# Patient Record
Sex: Male | Born: 1995 | Race: Black or African American | Hispanic: No | Marital: Single | Smoking: Never smoker
Health system: Southern US, Community
[De-identification: ages and names within clinical notes are randomized; demographics above are authoritative.]

## PROBLEM LIST (undated history)

## (undated) DIAGNOSIS — J45909 Unspecified asthma, uncomplicated: Secondary | ICD-10-CM

---

## 2017-03-31 DIAGNOSIS — R509 Fever, unspecified: Secondary | ICD-10-CM | POA: Diagnosis not present

## 2017-03-31 DIAGNOSIS — R112 Nausea with vomiting, unspecified: Secondary | ICD-10-CM | POA: Insufficient documentation

## 2017-03-31 DIAGNOSIS — R062 Wheezing: Secondary | ICD-10-CM | POA: Insufficient documentation

## 2017-03-31 DIAGNOSIS — R05 Cough: Secondary | ICD-10-CM | POA: Diagnosis present

## 2017-03-31 DIAGNOSIS — M791 Myalgia, unspecified site: Secondary | ICD-10-CM | POA: Insufficient documentation

## 2017-03-31 DIAGNOSIS — J3489 Other specified disorders of nose and nasal sinuses: Secondary | ICD-10-CM | POA: Insufficient documentation

## 2017-03-31 DIAGNOSIS — Z5321 Procedure and treatment not carried out due to patient leaving prior to being seen by health care provider: Secondary | ICD-10-CM | POA: Insufficient documentation

## 2017-04-01 ENCOUNTER — Emergency Department (HOSPITAL_COMMUNITY): Payer: BLUE CROSS/BLUE SHIELD

## 2017-04-01 ENCOUNTER — Encounter (HOSPITAL_COMMUNITY): Payer: Self-pay | Admitting: *Deleted

## 2017-04-01 ENCOUNTER — Emergency Department (HOSPITAL_COMMUNITY)
Admission: EM | Admit: 2017-04-01 | Discharge: 2017-04-01 | Disposition: A | Discharge status: Home / Self Care | Payer: BLUE CROSS/BLUE SHIELD | Attending: Emergency Medicine | Admitting: Emergency Medicine

## 2017-04-01 HISTORY — DX: Unspecified asthma, uncomplicated: J45.909

## 2017-04-01 MED ORDER — ALBUTEROL SULFATE (2.5 MG/3ML) 0.083% IN NEBU
5.0000 mg | INHALATION_SOLUTION | Freq: Once | RESPIRATORY_TRACT | Status: AC
  Administered 2017-04-01: 5 mg via RESPIRATORY_TRACT
  Filled 2017-04-01: qty 6

## 2017-04-01 NOTE — ED Triage Notes (Signed)
Pt states cough, congestion,  fever, nausea, post tussive vomiting, body aches since Monday. Has been taking his inhaler,  dayquil and nyquil for symptoms. Wheezing in triage.

## 2017-04-01 NOTE — ED Notes (Signed)
Pt called out stating he felt like he was wheezing again, pt breathing improved from prior assessment, vss, updated on current status.

## 2017-04-01 NOTE — ED Notes (Signed)
No response in waiting area to be taken to triage room.

## 2017-04-01 NOTE — ED Triage Notes (Signed)
Pt not available in waiting room to take to triage room

## 2018-03-31 ENCOUNTER — Encounter (HOSPITAL_COMMUNITY): Payer: Self-pay

## 2018-03-31 ENCOUNTER — Ambulatory Visit (HOSPITAL_COMMUNITY)
Admission: EM | Admit: 2018-03-31 | Discharge: 2018-03-31 | Disposition: A | Discharge status: Home / Self Care | Payer: BLUE CROSS/BLUE SHIELD | Attending: Family Medicine | Admitting: Family Medicine

## 2018-03-31 DIAGNOSIS — B2790 Infectious mononucleosis, unspecified without complication: Secondary | ICD-10-CM | POA: Diagnosis not present

## 2018-03-31 DIAGNOSIS — J029 Acute pharyngitis, unspecified: Secondary | ICD-10-CM | POA: Diagnosis not present

## 2018-03-31 LAB — POCT INFECTIOUS MONO SCREEN: Mono Screen: POSITIVE — AB

## 2018-03-31 MED ORDER — PREDNISONE 50 MG PO TABS: 50.0000 mg | ORAL_TABLET | Freq: Every day | ORAL | 0 refills | Status: AC

## 2018-03-31 MED ORDER — IBUPROFEN 600 MG PO TABS: 600.0000 mg | ORAL_TABLET | Freq: Four times a day (QID) | ORAL | 0 refills | Status: AC | PRN

## 2018-03-31 MED ORDER — ACETAMINOPHEN 160 MG/5ML PO SOLN
650.0000 mg | Freq: Once | ORAL | Status: AC
  Administered 2018-03-31: 650 mg via ORAL

## 2018-03-31 MED ORDER — ACETAMINOPHEN 160 MG/5ML PO SOLN
ORAL | Status: AC
  Filled 2018-03-31: qty 20.3

## 2018-03-31 NOTE — ED Triage Notes (Signed)
Pt presents with sore throat and fever since yesterday  ?

## 2018-03-31 NOTE — Discharge Instructions (Signed)
Strep negative, Mono positive This is a virus your body should fight over next 4-6 weeks Begin prednisone daily for 5 days to help with swelling and pain Use anti-inflammatories for pain/swelling. You may take up to 800 mg Ibuprofen every 8 hours with food. You may supplement Ibuprofen with Tylenol [phone] mg every 8 hours.  Rest Fluids Avoid contact sports/activities for next 4-6 weeks

## 2018-04-01 NOTE — ED Provider Notes (Signed)
[facility]    CSN: [id number] Arrival date & time: 03/31/18  1609     History   Chief Complaint Chief Complaint  Patient presents with  . Sore Throat  . Fever    HPI Christian Bonilla is a 23 y.o. male history of asthma presenting today for evaluation of fever and sore throat.  Patient states that over the past 2 days he has developed sore throat fever, body aches and fatigue.  He has had some mild rhinorrhea, but minimal cough.  Denies any upset stomach including nausea vomiting or diarrhea.  He has not taken anything for his symptoms.  HPI  Past Medical History:  Diagnosis Date  . Asthma     There are no active problems to display for this patient.   History reviewed. No pertinent surgical history.     Home Medications    Prior to Admission medications   Medication Sig Start Date End Date Taking? Authorizing Provider  ibuprofen (ADVIL,MOTRIN) 600 MG tablet Take 1 tablet (600 mg total) by mouth every 6 (six) hours as needed. 03/31/18   Tyne Banta C, PA-C  predniSONE (DELTASONE) 50 MG tablet Take 1 tablet (50 mg total) by mouth daily for 5 days. 03/31/18 04/05/18  Andoni Busch, Junius CreamerHallie C, PA-C    Family History Family History  Problem Relation Age of Onset  . Healthy Mother   . Healthy Father     Social History Social History   Tobacco Use  . Smoking status: Never Smoker  . Smokeless tobacco: Never Used  Substance Use Topics  . Alcohol use: No    Frequency: Never  . Drug use: No     Allergies   Patient has no known allergies.   Review of Systems Review of Systems  Constitutional: Positive for fatigue and fever. Negative for activity change, appetite change and chills.  HENT: Positive for rhinorrhea and sore throat. Negative for congestion, ear pain, sinus pressure and trouble swallowing.   Eyes: Negative for discharge and redness.  Respiratory: Negative for cough, chest tightness and shortness of breath.   Cardiovascular: Negative for chest  pain.  Gastrointestinal: Negative for abdominal pain, diarrhea, nausea and vomiting.  Musculoskeletal: Positive for myalgias.  Skin: Negative for rash.  Neurological: Negative for dizziness, light-headedness and headaches.     Physical Exam Triage Vital Signs ED Triage Vitals  Enc Vitals Group     BP 03/31/18 1716 (!) 123/55     Pulse Rate 03/31/18 1716 (!) 115     Resp 03/31/18 1716 18     Temp 03/31/18 1716 (!) 102.9 F (39.4 C)     Temp Source 03/31/18 1716 Oral     SpO2 03/31/18 1716 96 %     Weight --      Height --      Head Circumference --      Peak Flow --      Pain Score 03/31/18 1721 10     Pain Loc --      Pain Edu? --      Excl. in GC? --    No data found.  Updated Vital Signs BP (!) 123/55 (BP Location: Left Arm)   Pulse (!) 115   Temp (!) 102.9 F (39.4 C) (Oral)   Resp 18   SpO2 96%   Visual Acuity Right Eye Distance:   Left Eye Distance:   Bilateral Distance:    Right Eye Near:   Left Eye Near:    Bilateral Near:  Physical Exam Vitals signs and nursing note reviewed.  Constitutional:      Appearance: He is well-developed.  HENT:     Head: Normocephalic and atraumatic.     Ears:     Comments: Bilateral ears without tenderness to palpation of external auricle, tragus and mastoid, EAC's without erythema or swelling, TM's with good bony landmarks and cone of light. Non erythematous.    Mouth/Throat:     Comments: Bilateral tonsils significantly enlarged, erythematous with exudate, no uvula swelling or deviation, posterior pharynx patent and erythematous Eyes:     Conjunctiva/sclera: Conjunctivae normal.  Neck:     Musculoskeletal: Neck supple.  Cardiovascular:     Rate and Rhythm: Normal rate and regular rhythm.     Heart sounds: No murmur.  Pulmonary:     Effort: Pulmonary effort is normal. No respiratory distress.     Breath sounds: Normal breath sounds.     Comments: Breathing comfortably at rest, CTABL, no wheezing, rales or other  adventitious sounds auscultated Abdominal:     Palpations: Abdomen is soft.     Tenderness: There is no abdominal tenderness.  Skin:    General: Skin is warm and dry.  Neurological:     Mental Status: He is alert.      UC Treatments / Results  Labs (all labs ordered are listed, but only abnormal results are displayed) Labs Reviewed  POCT INFECTIOUS MONO SCREEN - Abnormal; Notable for the following components:      Result Value   Mono Screen POSITIVE (*)    All other components within normal limits  CULTURE, GROUP A STREP [facility])  POCT INFECTIOUS MONO SCREEN    EKG None  Radiology No results found.  Procedures Procedures (including critical care time)  Medications Ordered in UC Medications  acetaminophen (TYLENOL) solution 650 mg (650 mg Oral Given 03/31/18 1734)    Initial Impression / Assessment and Plan / UC Course  I have reviewed the triage vital signs and the nursing notes.  Pertinent labs & imaging results that were available during my care of the patient were reviewed by me and considered in my medical decision making (see chart for details).     Strep test negative, mono positive.  Will treat for mono with prednisone to help with throat swelling and pain, anti-inflammatories, push fluids, rest.  Advised to avoid contact sports and activities for the next month.  Provided information on mono.  Discussed typically takes 4 to 6 weeks to resolve.  Continue to monitor swelling and breathing,Discussed strict return precautions. Patient verbalized understanding and is agreeable with plan.  Final Clinical Impressions(s) / UC Diagnoses   Final diagnoses:  Infectious mononucleosis without complication, infectious mononucleosis due to unspecified organism     Discharge Instructions     Strep negative, Mono positive This is a virus your body should fight over next 4-6 weeks Begin prednisone daily for 5 days to help with swelling and pain Use anti-inflammatories for  pain/swelling. You may take up to 800 mg Ibuprofen every 8 hours with food. You may supplement Ibuprofen with Tylenol [phone] mg every 8 hours.  Rest Fluids Avoid contact sports/activities for next 4-6 weeks   ED Prescriptions    Medication Sig Dispense Auth. Provider   predniSONE (DELTASONE) 50 MG tablet Take 1 tablet (50 mg total) by mouth daily for 5 days. 5 tablet Tomekia Helton C, PA-C   ibuprofen (ADVIL,MOTRIN) 600 MG tablet Take 1 tablet (600 mg total) by mouth every 6 (six) hours  as needed. 30 tablet Kiran Carline, [place] C, PA-C     Controlled Substance Prescriptions [place] Controlled Substance Registry consulted? Not Applicable   Lew Dawes, [place] 04/01/18 [phone]

## 2018-04-03 ENCOUNTER — Telehealth (HOSPITAL_COMMUNITY): Payer: Self-pay | Admitting: Emergency Medicine

## 2018-04-03 LAB — CULTURE, GROUP A STREP (THRC)

## 2018-04-03 NOTE — Telephone Encounter (Signed)
Positive for not group A strep, attempted to contact patient to see how he was feeling, no answer, no voicemail.

## 2018-07-09 IMAGING — CR DG CHEST 2V
2 series · 2 of 2 positions shown · non-contrast
Comparison: None.

CLINICAL DATA: 21 y/o  M; cough and fever.

EXAM:
CHEST  2 VIEW

[w chest pa]
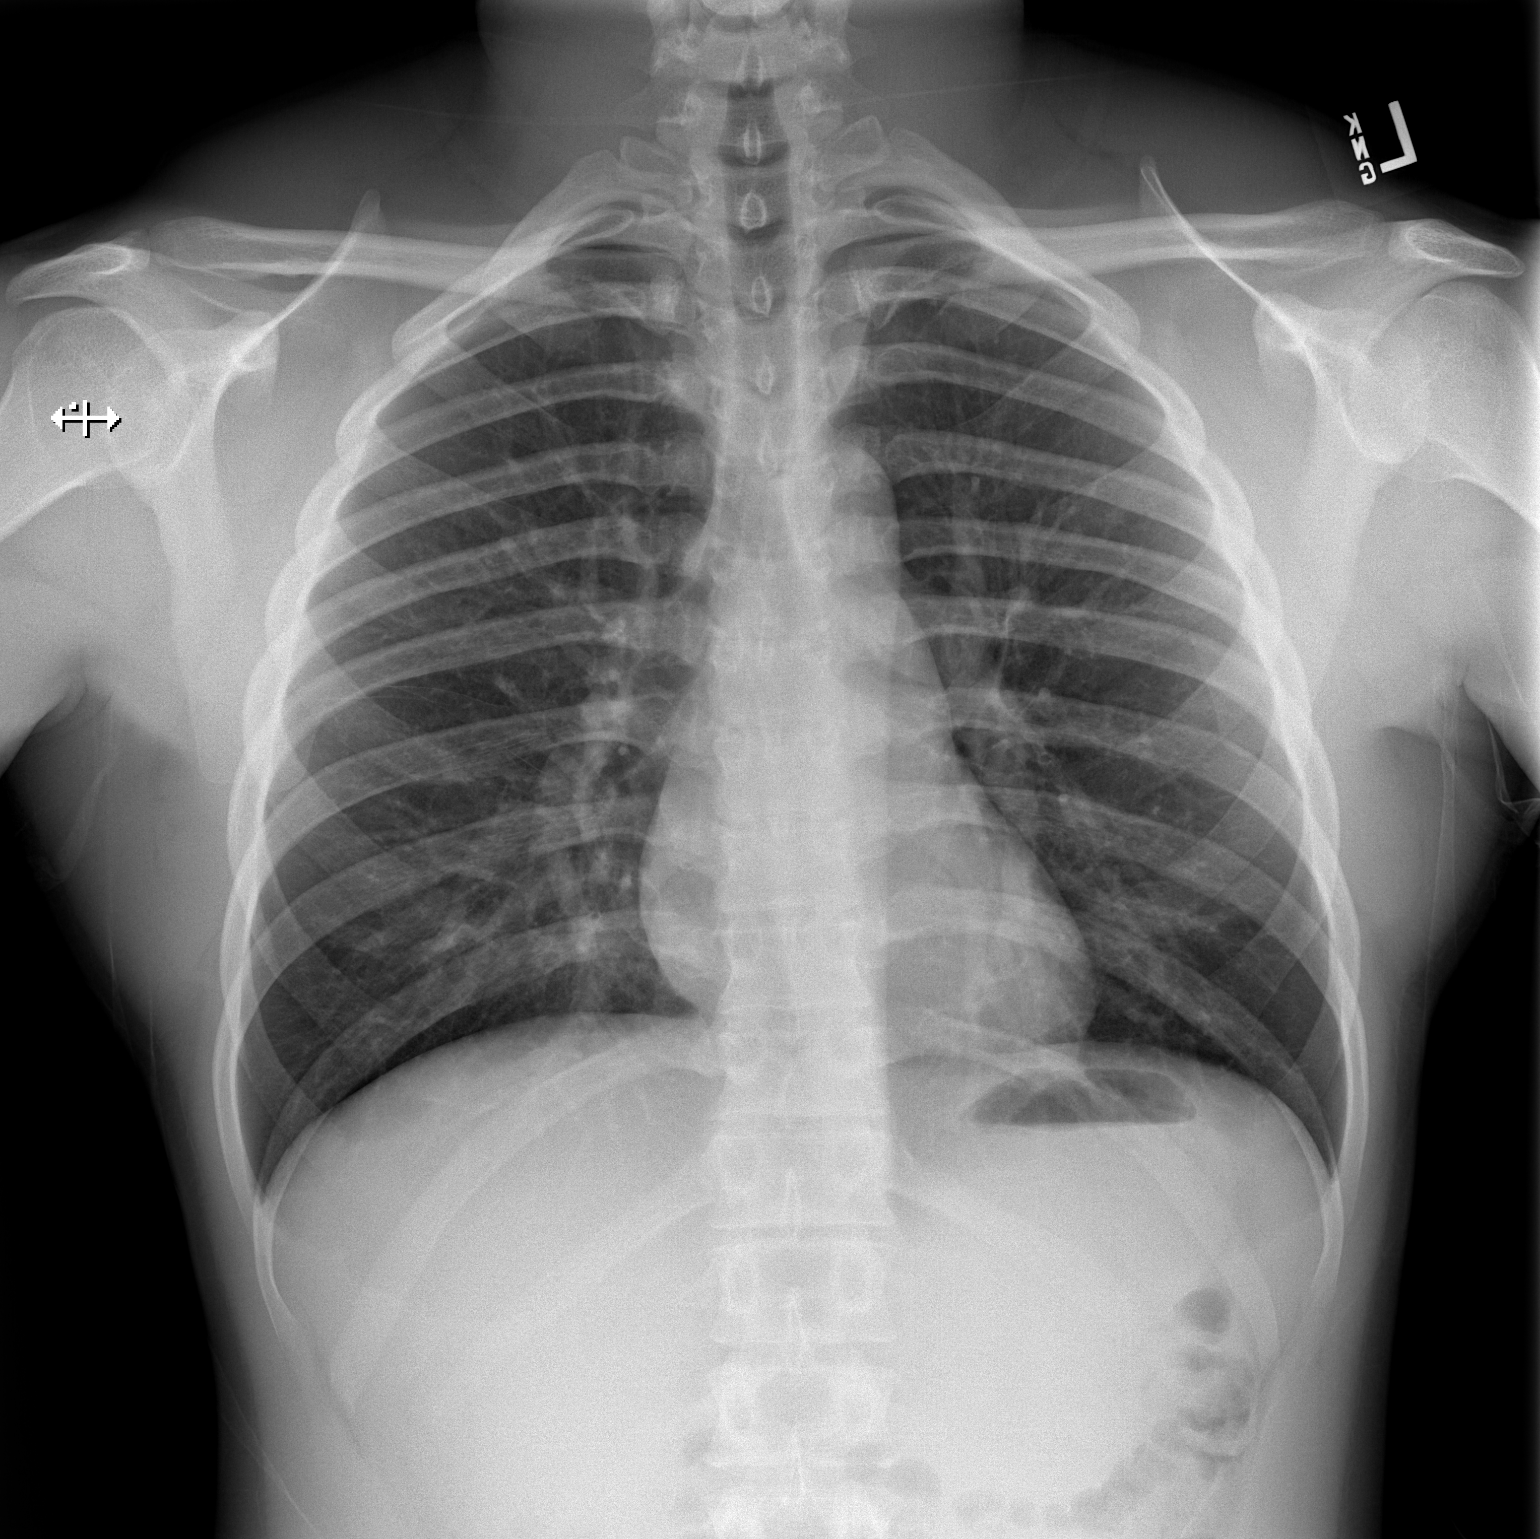

[w chest lat]
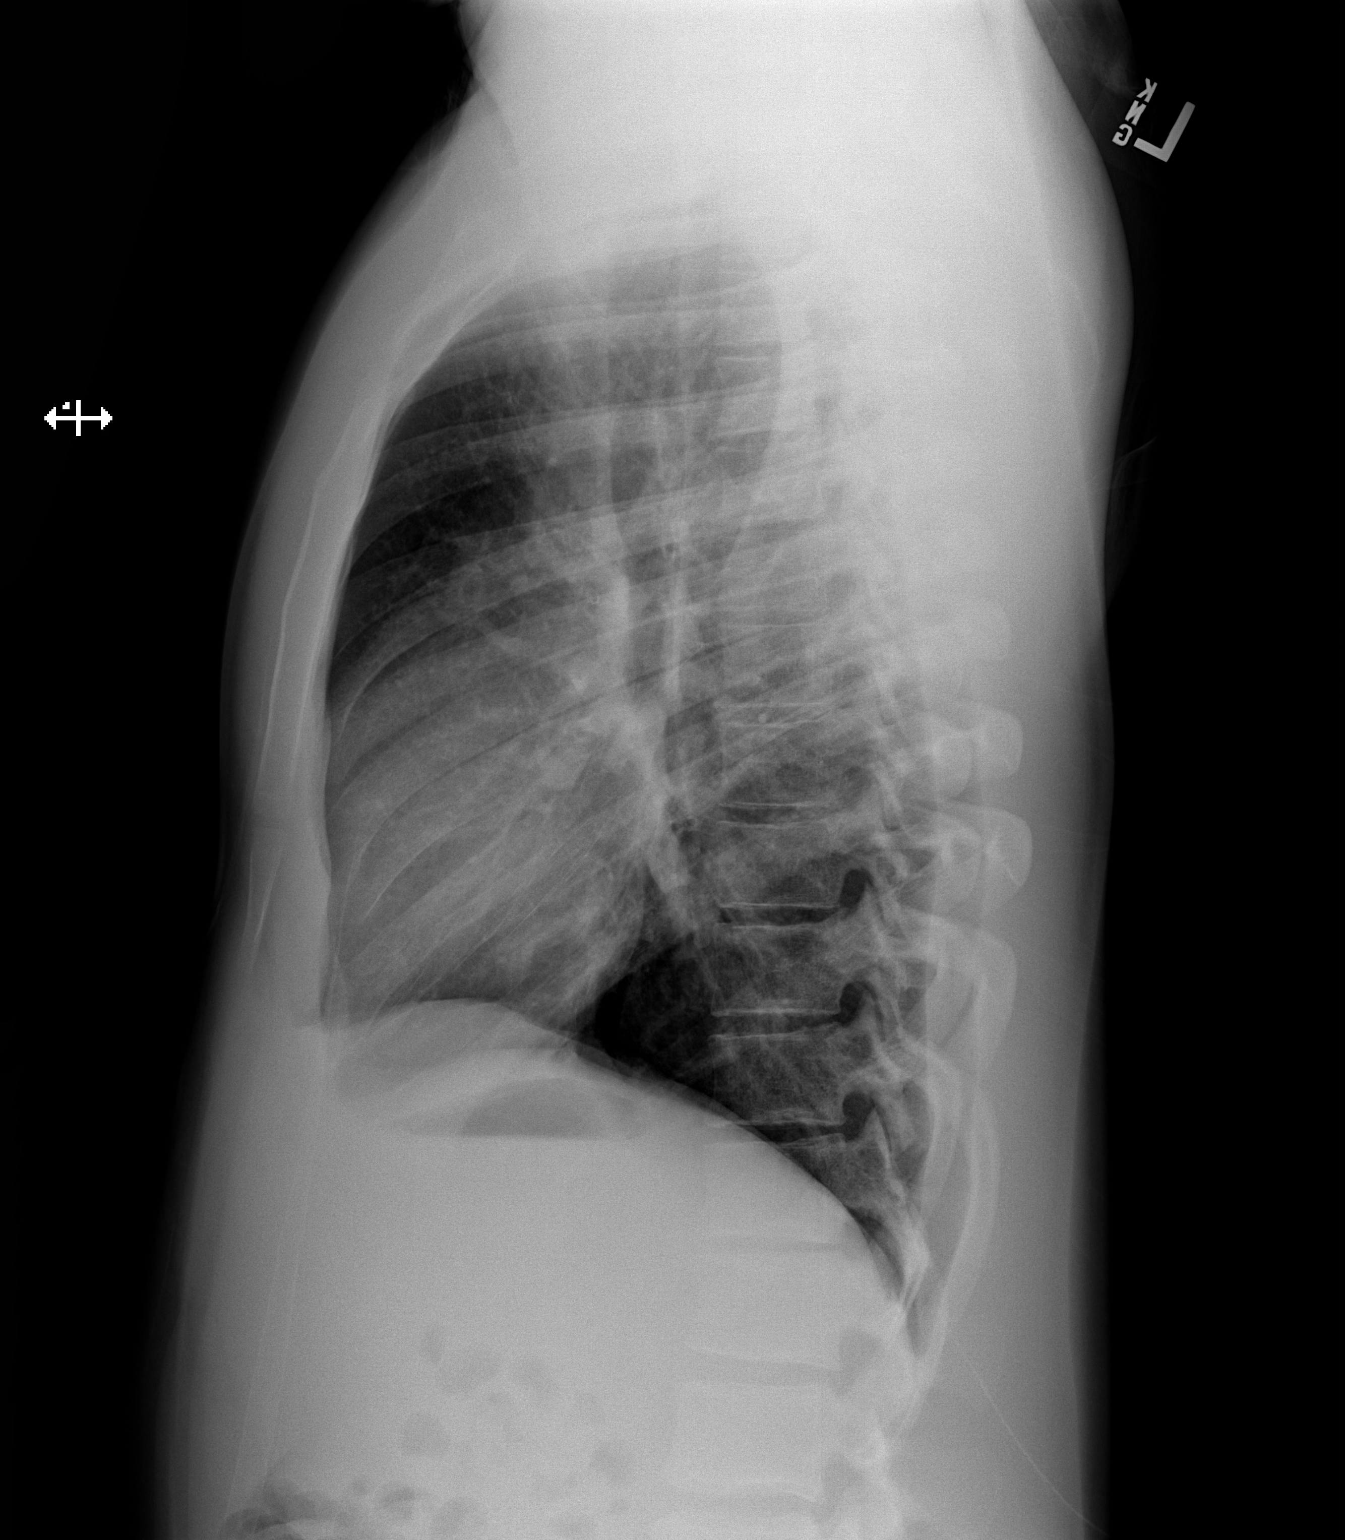

[2 of 2 positions shown; findings below may reference images not displayed]

FINDINGS: The heart size and mediastinal contours are within normal limits.
Both lungs are clear. The visualized skeletal structures are
unremarkable.
IMPRESSION: No active cardiopulmonary disease.

By: Precious Hooten M.D.

## 2018-10-01 ENCOUNTER — Ambulatory Visit (HOSPITAL_COMMUNITY)
Admission: EM | Admit: 2018-10-01 | Discharge: 2018-10-01 | Disposition: A | Discharge status: Home / Self Care | Payer: Medicaid Other | Attending: Family Medicine | Admitting: Family Medicine

## 2018-10-01 ENCOUNTER — Encounter (HOSPITAL_COMMUNITY): Payer: Self-pay | Admitting: Emergency Medicine

## 2018-10-01 ENCOUNTER — Ambulatory Visit (INDEPENDENT_AMBULATORY_CARE_PROVIDER_SITE_OTHER): Payer: Medicaid Other

## 2018-10-01 ENCOUNTER — Other Ambulatory Visit: Payer: Self-pay

## 2018-10-01 DIAGNOSIS — R079 Chest pain, unspecified: Secondary | ICD-10-CM

## 2018-10-01 DIAGNOSIS — R1013 Epigastric pain: Secondary | ICD-10-CM

## 2018-10-01 MED ORDER — OMEPRAZOLE 20 MG PO CPDR: 20.0000 mg | DELAYED_RELEASE_CAPSULE | Freq: Every day | ORAL | 0 refills | Status: AC

## 2018-10-01 NOTE — ED Triage Notes (Signed)
Pt sts mid sternal to epigastric pain worse with inspiration and after eating x 1 month intermittently

## 2018-10-01 NOTE — Discharge Instructions (Signed)
Your chest x ray and your EKG are both normal and reassuring I think your chest pain is from stomach acid  Try omeprazole once a day Call or return if this does not help in a week

## 2018-10-01 NOTE — ED Provider Notes (Signed)
[facility]    CSN: [id number] Arrival date & time: 10/01/18  1315     History   Chief Complaint Chief Complaint  Patient presents with  . Chest Pain    HPI Christian Bonilla is a 23 y.o. male.   HPI  Patient complains of chest pain.  Points to his mid epigastrium.  States is been present for about a month.  Is worse with a deep breath.  Sometimes food makes it worse as well.  Does not wake him up at night.  No nausea or vomiting or change in appetite.  No change in weight.  Does not drink alcohol.  Does not take ibuprofen or aspirin.  Does not smoke cigarettes.  Does not have a history of heartburn or acid reflux issues.  No hypertension, diabetes, hyperlipidemia, or family history of heart issues.  Past Medical History:  Diagnosis Date  . Asthma     There are no active problems to display for this patient.   History reviewed. No pertinent surgical history.     Home Medications    Prior to Admission medications   Medication Sig Start Date End Date Taking? Authorizing Provider  ibuprofen (ADVIL,MOTRIN) 600 MG tablet Take 1 tablet (600 mg total) by mouth every 6 (six) hours as needed. 03/31/18   Wieters, Hallie C, PA-C  omeprazole (PRILOSEC) 20 MG capsule Take 1 capsule (20 mg total) by mouth daily. 10/01/18   Raylene Everts, MD    Family History Family History  Problem Relation Age of Onset  . Healthy Mother   . Healthy Father     Social History Social History   Tobacco Use  . Smoking status: Never Smoker  . Smokeless tobacco: Never Used  Substance Use Topics  . Alcohol use: No    Frequency: Never  . Drug use: No     Allergies   Patient has no known allergies.   Review of Systems Review of Systems  Constitutional: Negative for chills and fever.  HENT: Negative for ear pain and sore throat.   Eyes: Negative for pain and visual disturbance.  Respiratory: Negative for cough and shortness of breath.   Cardiovascular: Positive for chest  pain. Negative for palpitations.  Gastrointestinal: Negative for abdominal pain and vomiting.  Genitourinary: Negative for dysuria and hematuria.  Musculoskeletal: Negative for arthralgias and back pain.  Skin: Negative for color change and rash.  Neurological: Negative for seizures and syncope.  All other systems reviewed and are negative.    Physical Exam Triage Vital Signs ED Triage Vitals  Enc Vitals Group     BP 10/01/18 1404 132/60     Pulse Rate 10/01/18 1404 68     Resp 10/01/18 1404 16     Temp 10/01/18 1404 98.7 F (37.1 C)     Temp Source 10/01/18 1404 Temporal     SpO2 10/01/18 1404 100 %     Weight --      Height --      Head Circumference --      Peak Flow --      Pain Score 10/01/18 1407 5     Pain Loc --      Pain Edu? --      Excl. in [place]? --    No data found.  Updated Vital Signs BP 132/60 (BP Location: Right Arm)   Pulse 68   Temp 98.7 F (37.1 C) (Temporal)   Resp 16   SpO2 100%   Physical Exam Constitutional:  General: He is not in acute distress.    Appearance: He is well-developed.  HENT:     Head: Normocephalic and atraumatic.  Eyes:     Conjunctiva/sclera: Conjunctivae normal.     Pupils: Pupils are equal, round, and reactive to light.  Neck:     Musculoskeletal: Normal range of motion.     Thyroid: No thyromegaly.  Cardiovascular:     Rate and Rhythm: Normal rate and regular rhythm.     Heart sounds: Normal heart sounds. No systolic murmur.  Pulmonary:     Effort: Pulmonary effort is normal. No respiratory distress.     Breath sounds: Normal breath sounds.  Chest:     Chest wall: Tenderness present.     Comments: Tenderness xiphoid process and mid epigastric area Abdominal:     General: There is no distension.     Palpations: Abdomen is soft.     Tenderness: There is abdominal tenderness.  Musculoskeletal: Normal range of motion.  Lymphadenopathy:     Cervical: No cervical adenopathy.  Skin:    General: Skin is warm and  dry.  Neurological:     General: No focal deficit present.     Mental Status: He is alert.  Psychiatric:        Mood and Affect: Mood normal.        Behavior: Behavior normal.      UC Treatments / Results  Labs (all labs ordered are listed, but only abnormal results are displayed) Labs Reviewed - No data to display  EKG- NSR, normal intervals with no ST or T changes   Radiology Dg Chest 2 View  Result Date: 10/01/2018 CLINICAL DATA:  Chest pain. EXAM: CHEST - 2 VIEW COMPARISON:  Radiographs of April 01, 2017. FINDINGS: The heart size and mediastinal contours are within normal limits. Both lungs are clear. No pneumothorax or pleural effusion is noted. The visualized skeletal structures are unremarkable. IMPRESSION: No active cardiopulmonary disease. Electronically Signed   By: Lupita RaiderJames  Green Jr M.D.   On: 10/01/2018 14:44    Procedures Procedures (including critical care time)  Medications Ordered in UC Medications - No data to display  Initial Impression / Assessment and Plan / UC Course  I have reviewed the triage vital signs and the nursing notes.  Pertinent labs & imaging results that were available during my care of the patient were reviewed by me and considered in my medical decision making (see chart for details).     Reviewed causes of chest pain including musculoskeletal, lung, GI, heart.   Final Clinical Impressions(s) / UC Diagnoses   Final diagnoses:  Abdominal pain, epigastric  Chest pain, unspecified type     Discharge Instructions     Your chest x ray and your EKG are both normal and reassuring I think your chest pain is from stomach acid  Try omeprazole once a day Call or return if this does not help in a week    ED Prescriptions    Medication Sig Dispense Auth. Provider   omeprazole (PRILOSEC) 20 MG capsule Take 1 capsule (20 mg total) by mouth daily. 30 capsule Eustace MooreNelson, Ahnesti Townsend Sue, MD     Controlled Substance Prescriptions [place] Controlled  Substance Registry consulted? No   Eustace MooreNelson, Kastin Cerda Sue, MD 10/01/18 [phone]

## 2018-12-10 ENCOUNTER — Encounter (HOSPITAL_COMMUNITY): Payer: Self-pay | Admitting: Emergency Medicine

## 2018-12-10 ENCOUNTER — Other Ambulatory Visit: Payer: Self-pay

## 2018-12-10 ENCOUNTER — Ambulatory Visit (HOSPITAL_COMMUNITY)
Admission: EM | Admit: 2018-12-10 | Discharge: 2018-12-10 | Disposition: A | Discharge status: Home / Self Care | Payer: Medicaid Other | Attending: Internal Medicine | Admitting: Internal Medicine

## 2018-12-10 DIAGNOSIS — J4521 Mild intermittent asthma with (acute) exacerbation: Secondary | ICD-10-CM | POA: Diagnosis not present

## 2018-12-10 MED ORDER — PREDNISONE 20 MG PO TABS
20.0000 mg | ORAL_TABLET | Freq: Every day | ORAL | 0 refills | Status: AC
Start: 2018-12-10 — End: 2018-12-15

## 2018-12-10 MED ORDER — ALBUTEROL SULFATE 0.63 MG/3ML IN NEBU: 1.0000 | INHALATION_SOLUTION | Freq: Four times a day (QID) | RESPIRATORY_TRACT | 1 refills | Status: AC | PRN

## 2018-12-10 MED ORDER — CETIRIZINE HCL 10 MG PO TABS: 10.0000 mg | ORAL_TABLET | Freq: Every day | ORAL | 2 refills | Status: AC

## 2018-12-10 NOTE — ED Provider Notes (Addendum)
[facility]    CSN: [id number] Arrival date & time: 12/10/18  1906      History   Chief Complaint Chief Complaint  Patient presents with  . Asthma    HPI Christian Bonilla is a 23 y.o. male with a history of asthma currently managed with albuterol nebulization comes to urgent care with complaints of worsening shortness of breath, wheezing, cough and chest congestion of 2 days duration.  Patient typically gets asthma exacerbation in the fall.  He has allergies but has not been taking any antihistamine on a regular basis.  Patient has been using albuterol nebulization solutions without any improvement.  He denies any fever or chills.  Any nausea or vomiting.  He is able to complete sentences.  He has some chest tightness but no chest pain.   HPI  Past Medical History:  Diagnosis Date  . Asthma     There are no active problems to display for this patient.   History reviewed. No pertinent surgical history.     Home Medications    Prior to Admission medications   Medication Sig Start Date End Date Taking? Authorizing Provider  ALBUTEROL IN Inhale into the lungs.   Yes [provider]  albuterol (ACCUNEB) 0.63 MG/3ML nebulizer solution Take 3 mLs (0.63 mg total) by nebulization every 6 (six) hours as needed for wheezing. 12/10/18   Merrilee Jansky, MD  cetirizine (ZYRTEC ALLERGY) 10 MG tablet Take 1 tablet (10 mg total) by mouth daily. 12/10/18   , Britta Mccreedy, MD  ibuprofen (ADVIL,MOTRIN) 600 MG tablet Take 1 tablet (600 mg total) by mouth every 6 (six) hours as needed. 03/31/18   Wieters, Hallie C, PA-C  omeprazole (PRILOSEC) 20 MG capsule Take 1 capsule (20 mg total) by mouth daily. 10/01/18   Eustace Moore, MD  predniSONE (DELTASONE) 20 MG tablet Take 1 tablet (20 mg total) by mouth daily for 5 days. 12/10/18 12/15/18  Merrilee Jansky, MD    Family History Family History  Problem Relation Age of Onset  . Healthy Mother   . Healthy Father      Social History Social History   Tobacco Use  . Smoking status: Never Smoker  . Smokeless tobacco: Never Used  Substance Use Topics  . Alcohol use: No    Frequency: Never  . Drug use: No     Allergies   Patient has no known allergies.   Review of Systems Review of Systems  Constitutional: Negative.   Respiratory: Positive for cough, chest tightness, shortness of breath and wheezing.   Cardiovascular: Negative for chest pain, palpitations and leg swelling.  Gastrointestinal: Negative.   Endocrine: Negative.   Musculoskeletal: Negative.   Skin: Negative.   Neurological: Negative.   Psychiatric/Behavioral: Negative for confusion.     Physical Exam Triage Vital Signs ED Triage Vitals  Enc Vitals Group     BP 12/10/18 1959 138/71     Pulse Rate 12/10/18 1959 91     Resp 12/10/18 1959 20     Temp 12/10/18 1959 98.4 F (36.9 C)     Temp Source 12/10/18 1959 Oral     SpO2 12/10/18 1959 97 %     Weight --      Height --      Head Circumference --      Peak Flow --      Pain Score 12/10/18 1955 8     Pain Loc --      Pain Edu? --  Excl. in GC? --    No data found.  Updated Vital Signs BP 138/71 (BP Location: Right Arm)   Pulse 91   Temp 98.4 F (36.9 C) (Oral)   Resp 20   SpO2 97%   Visual Acuity Right Eye Distance:   Left Eye Distance:   Bilateral Distance:    Right Eye Near:   Left Eye Near:    Bilateral Near:     Physical Exam Vitals signs and nursing note reviewed.  Constitutional:      Appearance: Normal appearance. He is not ill-appearing or toxic-appearing.  Neck:     Musculoskeletal: Normal range of motion. No neck rigidity or muscular tenderness.  Cardiovascular:     Rate and Rhythm: Normal rate and regular rhythm.     Pulses: Normal pulses.  Pulmonary:     Effort: No respiratory distress.     Breath sounds: No stridor. Wheezing present. No rhonchi.  Chest:     Chest wall: No tenderness.  Abdominal:     General: Bowel sounds  are normal. There is no distension.     Tenderness: There is no abdominal tenderness. There is no rebound.  Musculoskeletal: Normal range of motion.        General: No swelling, deformity or signs of injury.  Skin:    General: Skin is warm.     Capillary Refill: Capillary refill takes less than 2 seconds.     Coloration: Skin is not jaundiced.     Findings: No erythema or lesion.  Neurological:     General: No focal deficit present.     Mental Status: He is oriented to person, place, and time.      UC Treatments / Results  Labs (all labs ordered are listed, but only abnormal results are displayed) Labs Reviewed - No data to display  EKG   Radiology No results found.  Procedures Procedures (including critical care time)  Medications Ordered in UC Medications - No data to display  Initial Impression / Assessment and Plan / UC Course  I have reviewed the triage vital signs and the nursing notes.  Pertinent labs & imaging results that were available during my care of the patient were reviewed by me and considered in my medical decision making (see chart for details).     1.  Mild intermittent asthma with acute exacerbation: Zyrtec 10 mg orally daily Albuterol nebulization solutions every 6 hours as needed Prednisone 20 mg orally daily for 5 days Patient is advised to start taking nonsedating antihistamines before the fall season to decrease incidence of asthma exacerbations.  Patient is agreeable with that plan.  If his shortness of breath worsens he is advised to return to urgent care to be reevaluated. Final Clinical Impressions(s) / UC Diagnoses   Final diagnoses:  Mild intermittent asthma with acute exacerbation   Discharge Instructions   None    ED Prescriptions    Medication Sig Dispense Auth. Provider   cetirizine (ZYRTEC ALLERGY) 10 MG tablet Take 1 tablet (10 mg total) by mouth daily. 30 tablet , Britta MccreedyPhilip O, MD   albuterol (ACCUNEB) 0.63 MG/3ML  nebulizer solution Take 3 mLs (0.63 mg total) by nebulization every 6 (six) hours as needed for wheezing. 75 mL , Britta MccreedyPhilip O, MD   predniSONE (DELTASONE) 20 MG tablet Take 1 tablet (20 mg total) by mouth daily for 5 days. 5 tablet , Britta MccreedyPhilip O, MD     PDMP not reviewed this encounter.   , Britta MccreedyPhilip O, MD  12/11/18 1710    Chase Picket, MD 12/12/18 [phone]

## 2018-12-10 NOTE — ED Triage Notes (Signed)
Asthma exacerbation x 2 days.  Reports this is the time of year that asthma flares up. Requesting prednisone shot or pill

## 2020-01-08 IMAGING — DX CHEST - 2 VIEW
2 series · 2 of 2 positions shown · non-contrast
Comparison: Radiographs April 01, 2017.

CLINICAL DATA: Chest pain.

EXAM:
CHEST - 2 VIEW

[chest pa]
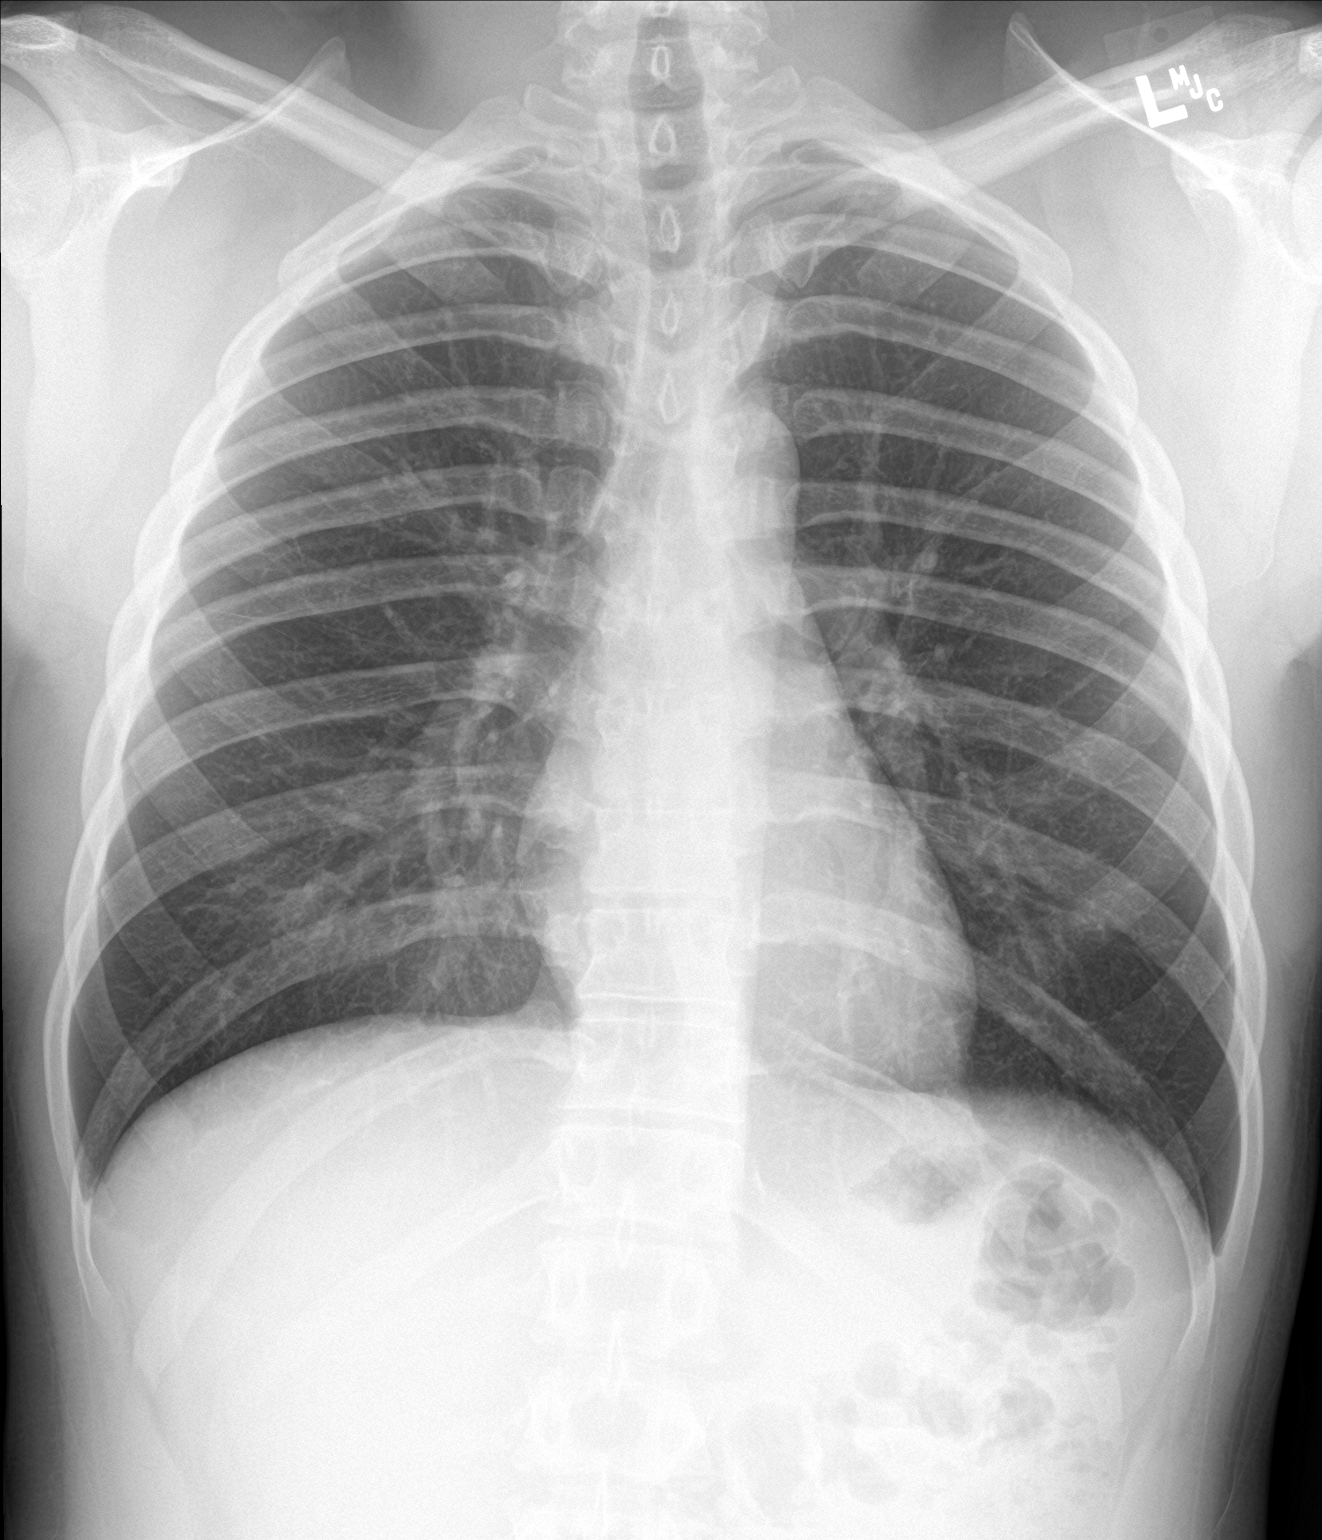

[chest lat]
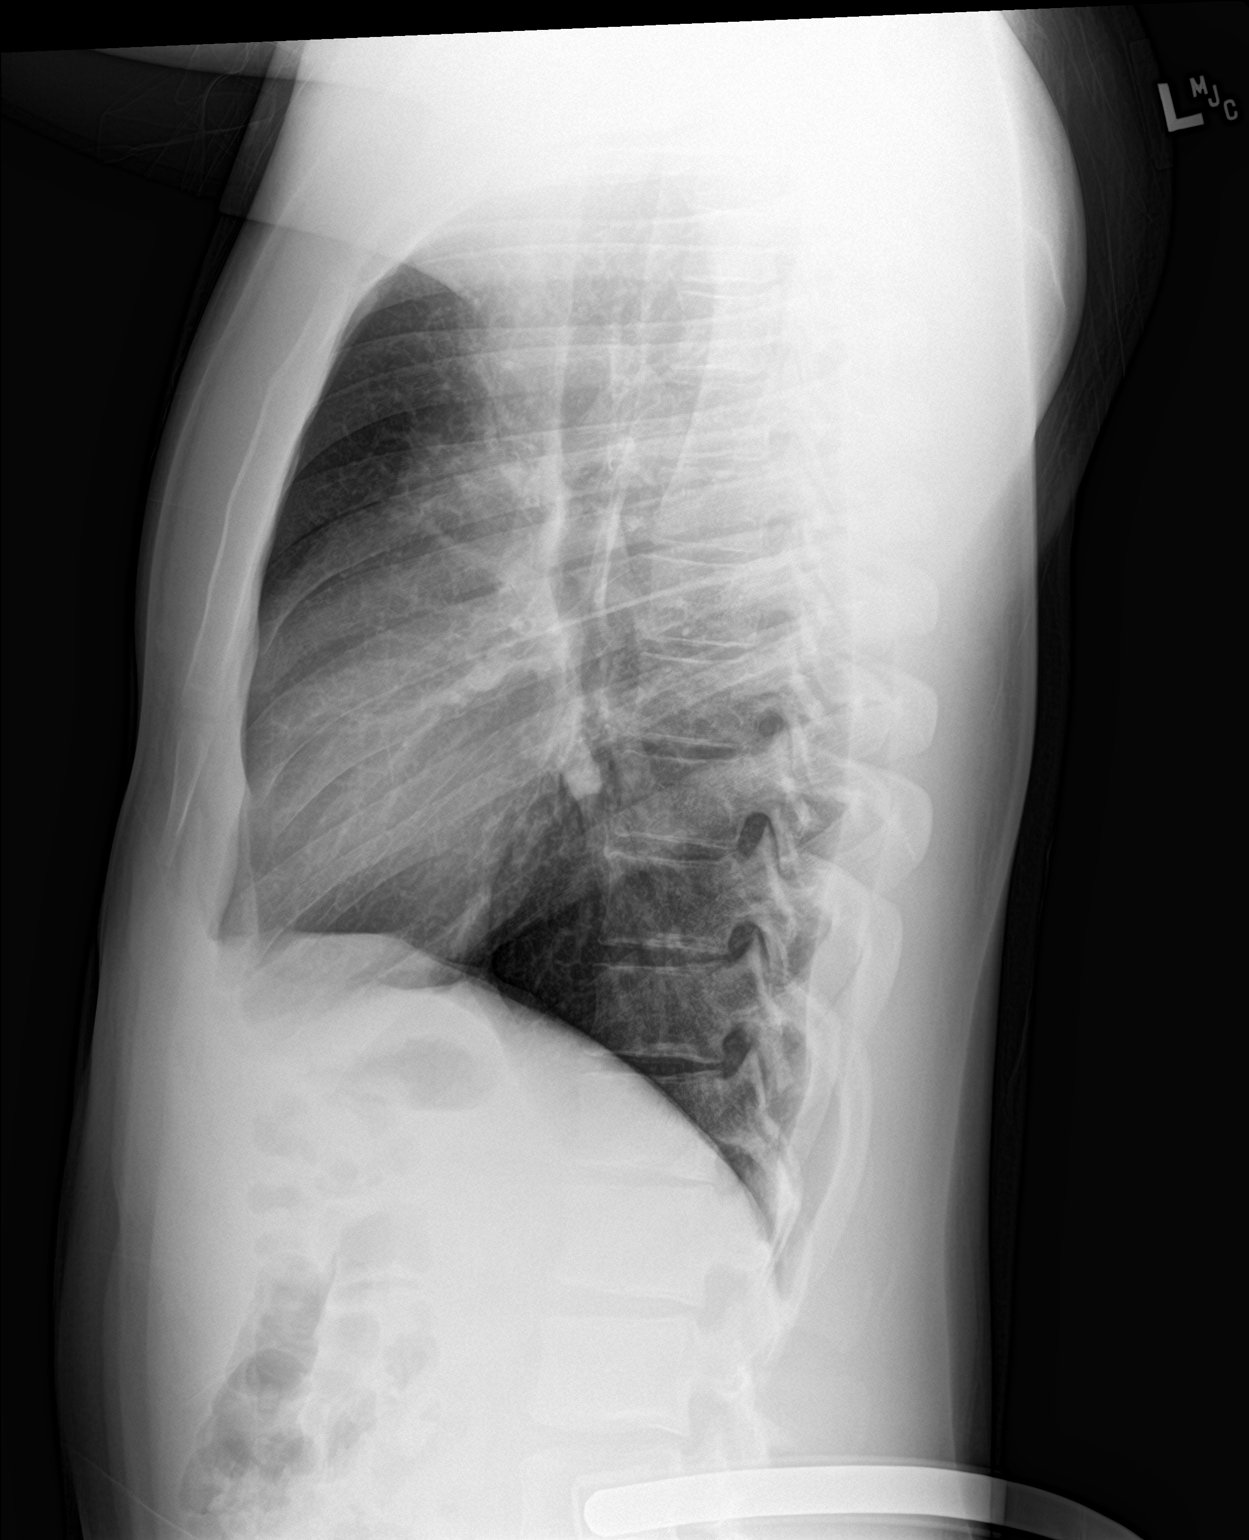

[2 of 2 positions shown; findings below may reference images not displayed]

FINDINGS: The heart size and mediastinal contours are within normal limits.
Both lungs are clear. No pneumothorax or pleural effusion is noted.
The visualized skeletal structures are unremarkable.
IMPRESSION: No active cardiopulmonary disease.
